# Patient Record
Sex: Female | Born: 1975 | Race: White | Hispanic: No | Smoking: Current every day smoker
Health system: Southern US, Community
[De-identification: ages and names within clinical notes are randomized; demographics above are authoritative.]

## PROBLEM LIST (undated history)

## (undated) DIAGNOSIS — M549 Dorsalgia, unspecified: Secondary | ICD-10-CM

## (undated) HISTORY — PX: ABDOMINAL HYSTERECTOMY: SHX81

---

## 2013-05-16 ENCOUNTER — Encounter (HOSPITAL_COMMUNITY): Payer: Self-pay | Admitting: Emergency Medicine

## 2013-05-16 ENCOUNTER — Emergency Department (HOSPITAL_COMMUNITY)
Admission: EM | Admit: 2013-05-16 | Discharge: 2013-05-16 | Disposition: A | Discharge status: Home / Self Care | Payer: Federal, State, Local not specified - PPO | Attending: Emergency Medicine | Admitting: Emergency Medicine

## 2013-05-16 DIAGNOSIS — F17200 Nicotine dependence, unspecified, uncomplicated: Secondary | ICD-10-CM | POA: Insufficient documentation

## 2013-05-16 DIAGNOSIS — Z79899 Other long term (current) drug therapy: Secondary | ICD-10-CM | POA: Insufficient documentation

## 2013-05-16 DIAGNOSIS — M545 Low back pain, unspecified: Secondary | ICD-10-CM | POA: Insufficient documentation

## 2013-05-16 DIAGNOSIS — Z791 Long term (current) use of non-steroidal anti-inflammatories (NSAID): Secondary | ICD-10-CM | POA: Insufficient documentation

## 2013-05-16 DIAGNOSIS — M542 Cervicalgia: Secondary | ICD-10-CM | POA: Insufficient documentation

## 2013-05-16 DIAGNOSIS — G8929 Other chronic pain: Secondary | ICD-10-CM | POA: Insufficient documentation

## 2013-05-16 HISTORY — DX: Dorsalgia, unspecified: M54.9

## 2013-05-16 MED ORDER — OXYCODONE-ACETAMINOPHEN 10-325 MG PO TABS: 1.0000 | ORAL_TABLET | Freq: Four times a day (QID) | ORAL | Status: DC | PRN
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
## 2013-05-16 NOTE — ED Notes (Signed)
Pt reports chronic neck and back pain, just moved here from [place] and does not have any more pain meds. Also has a recent cough/cold and congestion. No fevers. Breathing easily

## 2013-05-16 NOTE — ED Provider Notes (Signed)
Medical screening examination/treatment/procedure(s) were performed by non-physician practitioner and as supervising physician I was immediately available for consultation/collaboration.   Carolie Mcilrath T Chayce Robbins, MD 05/16/13 1555 

## 2013-05-26 ENCOUNTER — Encounter (HOSPITAL_COMMUNITY): Payer: Self-pay | Admitting: Emergency Medicine

## 2013-05-26 ENCOUNTER — Emergency Department (HOSPITAL_COMMUNITY)
Admission: EM | Admit: 2013-05-26 | Discharge: 2013-05-26 | Disposition: A | Discharge status: Home / Self Care | Payer: Federal, State, Local not specified - PPO | Attending: Emergency Medicine | Admitting: Emergency Medicine

## 2013-05-26 DIAGNOSIS — Z791 Long term (current) use of non-steroidal anti-inflammatories (NSAID): Secondary | ICD-10-CM | POA: Insufficient documentation

## 2013-05-26 DIAGNOSIS — G8921 Chronic pain due to trauma: Secondary | ICD-10-CM | POA: Insufficient documentation

## 2013-05-26 DIAGNOSIS — F17200 Nicotine dependence, unspecified, uncomplicated: Secondary | ICD-10-CM | POA: Insufficient documentation

## 2013-05-26 DIAGNOSIS — M545 Low back pain, unspecified: Secondary | ICD-10-CM | POA: Insufficient documentation

## 2013-05-26 DIAGNOSIS — G8929 Other chronic pain: Secondary | ICD-10-CM

## 2013-05-26 DIAGNOSIS — Z79899 Other long term (current) drug therapy: Secondary | ICD-10-CM | POA: Insufficient documentation

## 2013-05-26 NOTE — ED Notes (Signed)
Pt here c/o chronic lower back pain; pt sts just moved from [place] and has been unable to get set up with PCP or pain clinic; pt will need assistance with referral; pt denies new pain just needs rx

## 2013-05-26 NOTE — ED Provider Notes (Signed)
Medical screening examination/treatment/procedure(s) were performed by non-physician practitioner and as supervising physician I was immediately available for consultation/collaboration.  Flint Melter, MD 05/26/13 [phone]
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
## 2013-07-01 ENCOUNTER — Ambulatory Visit: Payer: Self-pay | Admitting: Pain Medicine

## 2013-10-29 DIAGNOSIS — G8929 Other chronic pain: Secondary | ICD-10-CM | POA: Insufficient documentation

## 2015-01-07 ENCOUNTER — Other Ambulatory Visit: Payer: Self-pay

## 2015-01-07 DIAGNOSIS — M2550 Pain in unspecified joint: Principal | ICD-10-CM

## 2015-01-07 DIAGNOSIS — G8929 Other chronic pain: Secondary | ICD-10-CM

## 2015-01-07 MED ORDER — MELOXICAM 7.5 MG PO TABS: 7.5000 mg | ORAL_TABLET | Freq: Two times a day (BID) | ORAL | Status: DC

## 2015-01-07 NOTE — Telephone Encounter (Signed)
Received a fax from [facility] on [address] [name]), requesting a refill of her, meloxicam 7.5mg 

## 2015-02-09 ENCOUNTER — Other Ambulatory Visit: Payer: Self-pay | Admitting: Family Medicine

## 2015-02-09 DIAGNOSIS — M4727 Other spondylosis with radiculopathy, lumbosacral region: Secondary | ICD-10-CM

## 2015-02-09 MED ORDER — OXYCODONE-ACETAMINOPHEN 10-325 MG PO TABS: 1.0000 | ORAL_TABLET | Freq: Two times a day (BID) | ORAL | Status: DC | PRN

## 2015-02-09 NOTE — Telephone Encounter (Signed)
Refill request was sent to Dr. Bobetta Lime for approval and submission.,

## 2015-02-09 NOTE — Telephone Encounter (Signed)
Patient scheduled appointment for August. She is requesting a refill on Oxycodone.

## 2015-02-23 ENCOUNTER — Other Ambulatory Visit: Payer: Self-pay | Admitting: Family Medicine

## 2015-02-23 DIAGNOSIS — F418 Other specified anxiety disorders: Secondary | ICD-10-CM

## 2015-03-01 ENCOUNTER — Ambulatory Visit: Payer: Self-pay | Admitting: Family Medicine

## 2015-05-19 ENCOUNTER — Other Ambulatory Visit: Payer: Self-pay | Admitting: Family Medicine

## 2015-05-19 ENCOUNTER — Ambulatory Visit (INDEPENDENT_AMBULATORY_CARE_PROVIDER_SITE_OTHER): Payer: Federal, State, Local not specified - PPO | Admitting: Family Medicine

## 2015-05-19 ENCOUNTER — Encounter: Payer: Self-pay | Admitting: Family Medicine

## 2015-05-19 VITALS — BP 122/80 | HR 90 | Temp 98.1°F | Resp 16 | Ht 67.0 in | Wt 174.1 lb

## 2015-05-19 DIAGNOSIS — B850 Pediculosis due to Pediculus humanus capitis: Secondary | ICD-10-CM | POA: Insufficient documentation

## 2015-05-19 DIAGNOSIS — Z131 Encounter for screening for diabetes mellitus: Secondary | ICD-10-CM | POA: Insufficient documentation

## 2015-05-19 DIAGNOSIS — B369 Superficial mycosis, unspecified: Secondary | ICD-10-CM | POA: Insufficient documentation

## 2015-05-19 DIAGNOSIS — M47817 Spondylosis without myelopathy or radiculopathy, lumbosacral region: Secondary | ICD-10-CM | POA: Insufficient documentation

## 2015-05-19 DIAGNOSIS — G5600 Carpal tunnel syndrome, unspecified upper limb: Secondary | ICD-10-CM | POA: Insufficient documentation

## 2015-05-19 DIAGNOSIS — Z789 Other specified health status: Secondary | ICD-10-CM | POA: Insufficient documentation

## 2015-05-19 DIAGNOSIS — H60339 Swimmer's ear, unspecified ear: Secondary | ICD-10-CM | POA: Insufficient documentation

## 2015-05-19 DIAGNOSIS — M47812 Spondylosis without myelopathy or radiculopathy, cervical region: Secondary | ICD-10-CM | POA: Insufficient documentation

## 2015-05-19 DIAGNOSIS — M549 Dorsalgia, unspecified: Secondary | ICD-10-CM

## 2015-05-19 DIAGNOSIS — J320 Chronic maxillary sinusitis: Secondary | ICD-10-CM | POA: Insufficient documentation

## 2015-05-19 DIAGNOSIS — M542 Cervicalgia: Secondary | ICD-10-CM | POA: Diagnosis not present

## 2015-05-19 DIAGNOSIS — H6690 Otitis media, unspecified, unspecified ear: Secondary | ICD-10-CM | POA: Insufficient documentation

## 2015-05-19 DIAGNOSIS — M1990 Unspecified osteoarthritis, unspecified site: Secondary | ICD-10-CM | POA: Insufficient documentation

## 2015-05-19 DIAGNOSIS — Z0000 Encounter for general adult medical examination without abnormal findings: Secondary | ICD-10-CM | POA: Insufficient documentation

## 2015-05-19 DIAGNOSIS — D1800 Hemangioma unspecified site: Secondary | ICD-10-CM | POA: Insufficient documentation

## 2015-05-19 DIAGNOSIS — F3341 Major depressive disorder, recurrent, in partial remission: Secondary | ICD-10-CM

## 2015-05-19 DIAGNOSIS — M5412 Radiculopathy, cervical region: Secondary | ICD-10-CM | POA: Insufficient documentation

## 2015-05-19 DIAGNOSIS — G8929 Other chronic pain: Secondary | ICD-10-CM | POA: Insufficient documentation

## 2015-05-19 DIAGNOSIS — Z124 Encounter for screening for malignant neoplasm of cervix: Secondary | ICD-10-CM | POA: Insufficient documentation

## 2015-05-19 DIAGNOSIS — Z23 Encounter for immunization: Secondary | ICD-10-CM | POA: Insufficient documentation

## 2015-05-19 DIAGNOSIS — M2550 Pain in unspecified joint: Secondary | ICD-10-CM

## 2015-05-19 DIAGNOSIS — Z13220 Encounter for screening for lipoid disorders: Secondary | ICD-10-CM | POA: Insufficient documentation

## 2015-05-19 DIAGNOSIS — F418 Other specified anxiety disorders: Secondary | ICD-10-CM

## 2015-05-19 DIAGNOSIS — E041 Nontoxic single thyroid nodule: Secondary | ICD-10-CM | POA: Insufficient documentation

## 2015-05-19 MED ORDER — BUPROPION HCL ER (XL) 150 MG PO TB24: 150.0000 mg | ORAL_TABLET | Freq: Every day | ORAL | Status: DC

## 2015-05-19 MED ORDER — DULOXETINE HCL 60 MG PO CPEP: 60.0000 mg | ORAL_CAPSULE | Freq: Two times a day (BID) | ORAL | Status: DC

## 2015-05-19 MED ORDER — PREGABALIN 50 MG PO CAPS: ORAL_CAPSULE | ORAL | Status: DC

## 2015-05-19 MED ORDER — MELOXICAM 15 MG PO TABS: 15.0000 mg | ORAL_TABLET | Freq: Every day | ORAL | Status: DC

## 2015-05-19 MED ORDER — OXYCODONE-ACETAMINOPHEN 10-325 MG PO TABS: 1.0000 | ORAL_TABLET | Freq: Two times a day (BID) | ORAL | Status: DC | PRN

## 2015-05-19 MED ORDER — OXYCODONE-ACETAMINOPHEN 10-325 MG PO TABS: 1.0000 | ORAL_TABLET | Freq: Three times a day (TID) | ORAL | Status: DC | PRN

## 2015-05-19 MED ORDER — TIZANIDINE HCL 4 MG PO TABS: 4.0000 mg | ORAL_TABLET | Freq: Three times a day (TID) | ORAL | Status: DC | PRN

## 2015-05-19 MED ORDER — DULOXETINE HCL 60 MG PO CPEP: 60.0000 mg | ORAL_CAPSULE | Freq: Every day | ORAL | Status: DC

## 2015-05-19 NOTE — Progress Notes (Signed)
Name: Nichole Beltran   MRN: [id number]    DOB: 06-18-1976   Date:05/19/2015       Progress Note  Subjective  Chief Complaint  Chief Complaint  Patient presents with  . Medication Refill  . Follow-up    From last visit 10/27/2014    HPI   Nichole Beltran is a 39 year old female with chronic neck and back pain here for refills.   Joint/Muscle Pain: Patient complains of arthralgias for which has been present for several years. Pain is located in multiple joints including posterior neck, lower back, lower extremities. Pain is described as aching, stabbing and throbbing, and is moderate but has been known to be severe at times. Associated symptoms include: depression, decreased range of motion and muscle tension. The patient is currently using Meloxicam 15 mg a day, Tizanadine 4 mg as needed, Percocet 10-315 mg twice a day as needed and Lyrica 50 mg one in the morning one at lunch and 2 at night. In the past I had tried referring her to pain management specialist but her case is related to workman's comp and due to records and paperwork she never quite established with specialists.   Depression: Patient complains of depression which is stable. She previously complained of depressed mood, difficulty concentrating, fatigue, feelings of worthlessness/guilt, impaired memory and insomnia. Onset was approximately several years ago, stable since that time.  She denies current suicidal and homicidal plan or intent.   Family history significant for anxiety and depression.Possible organic causes contributing are: neuro.  Risk factors: previous episode of depression Previous treatment includes Celexa and Wellbutrin and individual therapy. She complains of the following side effects from the treatment: none.     Past Medical History  Diagnosis Date  . Back pain     Patient Active Problem List   Diagnosis Date Noted  . Hemangioma 05/19/2015  . Chronic neck and back pain 05/19/2015  . Thyroid nodule  05/19/2015  . Chronic pain of multiple joints 05/19/2015  . Encounter for general adult medical examination without abnormal findings 05/19/2015  . Back pain, chronic 05/19/2015  . Carpal tunnel syndrome 05/19/2015  . Cervical nerve root disorder 05/19/2015  . Cervical spondylosis without myelopathy 05/19/2015  . Arthritis, degenerative 05/19/2015  . Clinical depression 05/19/2015  . Encounter for screening for diabetes mellitus 05/19/2015  . Cutaneous fungal infection 05/19/2015  . Gravida 2 para 2 05/19/2015  . Head lice 28/36/6294  . Flu vaccine need 05/19/2015  . Maxillary antritis 05/19/2015  . Lumbar and sacral osteoarthritis 05/19/2015  . Chronic cervical pain 05/19/2015  . Beach ear 05/19/2015  . Middle ear infection 05/19/2015  . Encounter for screening for lipoid disorders 05/19/2015  . Pap smear for cervical cancer screening 05/19/2015  . Chronic pain 10/29/2013    Social History  Substance Use Topics  . Smoking status: Current Every Day Smoker    Types: Cigarettes  . Smokeless tobacco: Not on file  . Alcohol Use: No     Current outpatient prescriptions:  .  buPROPion (WELLBUTRIN XL) 150 MG 24 hr tablet, Take 150 mg by mouth at bedtime., Disp: , Rfl:  .  DULoxetine (CYMBALTA) 60 MG capsule, TAKE ONE CAPSULE BY MOUTH TWICE DAILY, Disp: 180 capsule, Rfl: 1 .  meloxicam (MOBIC) 15 MG tablet, Take 15 mg by mouth daily., Disp: , Rfl:  .  oxyCODONE-acetaminophen (PERCOCET) 10-325 MG per tablet, Take 1 tablet by mouth 2 (two) times daily as needed for pain., Disp: 60 tablet, Rfl:  0 .  pregabalin (LYRICA) 50 MG capsule, Take 50-100 mg by mouth 3 (three) times daily. Take 50mg  in the morning, 50 mg at lunch, and 100mg  at bedtime., Disp: , Rfl:  .  tiZANidine (ZANAFLEX) 4 MG tablet, Take 4 mg by mouth every 6 (six) hours as needed (muscle spasms)., Disp: , Rfl:   Past Surgical History  Procedure Laterality Date  . Abdominal hysterectomy      No family history on  file.  No Known Allergies   Review of Systems  CONSTITUTIONAL: No significant weight changes, fever, chills, weakness or fatigue.  HEENT:  - Eyes: No visual changes.  - Ears: No auditory changes. No pain.  - Nose: No sneezing, congestion, runny nose. - Throat: No sore throat. No changes in swallowing. SKIN: No rash or itching.  CARDIOVASCULAR: No chest pain, chest pressure or chest discomfort. No palpitations or edema.  RESPIRATORY: No shortness of breath, cough or sputum.  NEUROLOGICAL: No headache, dizziness, syncope, paralysis, ataxia, numbness or tingling in the extremities. No memory changes. No change in bowel or bladder control.  MUSCULOSKELETAL: Chronic joint pain. No muscle pain. HEMATOLOGIC: No anemia, bleeding or bruising.  LYMPHATICS: No enlarged lymph nodes.  PSYCHIATRIC: No change in mood. No change in sleep pattern.  ENDOCRINOLOGIC: No reports of sweating, cold or heat intolerance. No polyuria or polydipsia.     Objective  BP 122/80 mmHg  Pulse 90  Temp(Src) 98.1 F (36.7 C) (Oral)  Resp 16  Ht 5\' 7"  (1.702 m)  Wt 174 lb 1.6 oz (78.971 kg)  BMI 27.26 kg/m2  SpO2 98% Body mass index is 27.26 kg/(m^2).  Physical Exam  Constitutional: Patient appears well-developed and well-nourished. In no distress.  Neck: Normal range of motion. Neck supple. No JVD present. No thyromegaly present.  Cardiovascular: Normal rate, regular rhythm and normal heart sounds.  No murmur heard.  Pulmonary/Chest: Effort normal and breath sounds normal. No respiratory distress. Musculoskeletal: Normal range of motion bilateral UE and LE, no joint effusions. Peripheral vascular: Bilateral LE no edema. Neurological: CN II-XII grossly intact with no focal deficits. Alert and oriented to person, place, and time. Coordination, balance, strength, speech and gait are normal.  Skin: Skin is warm and dry. No rash noted. No erythema.  Psychiatric: Patient has a normal mood and affect. Behavior  is normal in office today. Judgment and thought content normal in office today.  Assessment & Plan   1. Chronic neck and back pain Stable pain control. Three month supply provided. May call for 3 month refill and follow up in 6 months if remains stable.   The patient has been prescribed a controled substance today under the agreement of a filed pain treatment regimen contract.  With use of this medication they verbalize understanding the potential risk of addiction, abuse, and misuse, which can lead to overdose and death. The patient may not obtain and use other illicit or controled substances from any other sources while under the aformentioned contract. A urine drug screen will be performed periodically and the patient's name will be reviewed on the [place] Controlled Substance Reporting System regularly.  The patient expresses understanding the above statement and agreement to comply.  The patient has been counseled on the proper use, side effects and potential interactions of the new medication with other prescribed and OTC medications. Under no circumstances is this (and any other) medication to be use with alcohol or other illicit drugs. This medication is not to be crushed, chewed, sniffed, injected or  used in any other way other than what is stated in the directions. This medication is to be used at the frequency and quantity that is stated in the directions. This medication is to be used only by the individual who's name is on the prescription bottle. Drug sharing and selling is unacceptable. Patient voices understanding what has been said today.  - pregabalin (LYRICA) 50 MG capsule; Take 50mg  PO in the morning, 50 mg at lunch, and 100mg  at bedtime.  Dispense: 120 capsule; Refill: 5 - oxyCODONE-acetaminophen (PERCOCET) 10-325 MG tablet; Take 1 tablet by mouth 2 (two) times daily as needed for pain.  Dispense: 60 tablet; Refill: 0 - oxyCODONE-acetaminophen (PERCOCET) 10-325 MG tablet; Take 1 tablet  by mouth 2 (two) times daily as needed for pain.  Dispense: 60 tablet; Refill: 0 - oxyCODONE-acetaminophen (PERCOCET) 10-325 MG tablet; Take 1 tablet by mouth every 8 (eight) hours as needed for pain.  Dispense: 60 tablet; Refill: 0 - meloxicam (MOBIC) 15 MG tablet; Take 1 tablet (15 mg total) by mouth daily.  Dispense: 90 tablet; Refill: 2 - tiZANidine (ZANAFLEX) 4 MG tablet; Take 1 tablet (4 mg total) by mouth every 8 (eight) hours as needed (muscle spasms).  Dispense: 100 tablet; Refill: 3  2. Major depressive disorder, recurrent episode, in partial remission with anxious distress (HCC) Stable.  - buPROPion (WELLBUTRIN XL) 150 MG 24 hr tablet; Take 1 tablet (150 mg total) by mouth at bedtime.  Dispense: 90 tablet; Refill: 2 - DULoxetine (CYMBALTA) 60 MG capsule; Take 1 capsule (60 mg total) by mouth daily.  Dispense: 90 capsule; Refill: 2

## 2015-08-19 ENCOUNTER — Telehealth: Payer: Self-pay | Admitting: Family Medicine

## 2015-08-19 NOTE — Telephone Encounter (Signed)
ERRENOUS °

## 2015-08-27 ENCOUNTER — Ambulatory Visit (INDEPENDENT_AMBULATORY_CARE_PROVIDER_SITE_OTHER): Payer: Federal, State, Local not specified - PPO | Admitting: Family Medicine

## 2015-08-27 ENCOUNTER — Encounter: Payer: Self-pay | Admitting: Family Medicine

## 2015-08-27 VITALS — BP 118/86 | HR 89 | Temp 97.7°F | Resp 14 | Ht 67.0 in | Wt 184.0 lb

## 2015-08-27 DIAGNOSIS — F418 Other specified anxiety disorders: Principal | ICD-10-CM

## 2015-08-27 DIAGNOSIS — E663 Overweight: Secondary | ICD-10-CM | POA: Insufficient documentation

## 2015-08-27 DIAGNOSIS — M542 Cervicalgia: Secondary | ICD-10-CM

## 2015-08-27 DIAGNOSIS — F3341 Major depressive disorder, recurrent, in partial remission: Secondary | ICD-10-CM

## 2015-08-27 DIAGNOSIS — M47812 Spondylosis without myelopathy or radiculopathy, cervical region: Secondary | ICD-10-CM

## 2015-08-27 DIAGNOSIS — M47817 Spondylosis without myelopathy or radiculopathy, lumbosacral region: Secondary | ICD-10-CM | POA: Diagnosis not present

## 2015-08-27 DIAGNOSIS — M549 Dorsalgia, unspecified: Secondary | ICD-10-CM

## 2015-08-27 DIAGNOSIS — G8929 Other chronic pain: Secondary | ICD-10-CM

## 2015-08-27 MED ORDER — PREGABALIN 50 MG PO CAPS: ORAL_CAPSULE | ORAL | Status: DC

## 2015-08-27 MED ORDER — OXYCODONE-ACETAMINOPHEN 10-325 MG PO TABS: 1.0000 | ORAL_TABLET | Freq: Three times a day (TID) | ORAL | Status: DC | PRN

## 2015-08-27 MED ORDER — OXYCODONE-ACETAMINOPHEN 10-325 MG PO TABS: 1.0000 | ORAL_TABLET | Freq: Two times a day (BID) | ORAL | Status: DC | PRN

## 2015-08-27 MED ORDER — TIZANIDINE HCL 4 MG PO TABS: 4.0000 mg | ORAL_TABLET | Freq: Three times a day (TID) | ORAL | Status: DC | PRN

## 2015-08-27 NOTE — Progress Notes (Signed)
Name: Nichole Beltran   MRN: [id number]    DOB: [birth year]   Date:08/27/2015       Progress Note  Subjective  Chief Complaint  Chief Complaint  Patient presents with  . Medication Refill  . Depression  . Pain    lower back    HPI  Nichole Beltran is a 40 year old female with chronic neck and back pain here for refills. Has gained weight.  Joint/Muscle Pain: Patient complains of arthralgias for which has been present for several years. Pain is located in multiple joints including posterior neck, lower back, lower extremities. Pain is described as aching, stabbing and throbbing, and is moderate but has been known to be severe at times. Associated symptoms include: depression, decreased range of motion and muscle tension. The patient is currently using Meloxicam 15 mg a day, Tizanadine 4 mg as needed, Percocet 10-315 mg twice a day as needed and Lyrica 50 mg one in the morning one at lunch and 2 at night. In the past I had tried referring her to pain management specialist but her case is related to workman's comp and due to records and paperwork she never quite established with specialists.   Depression: Patient complains of depression which is stable. She previously complained of depressed mood, difficulty concentrating, fatigue, feelings of worthlessness/guilt, impaired memory and insomnia. Onset was approximately several years ago, stable since that time. She denies current suicidal and homicidal plan or intent. Family history significant for anxiety and depression.Possible organic causes contributing are: neuro. Risk factors: previous episode of depression Previous treatment includes Celexa and Wellbutrin and individual therapy. She complains of the following side effects from the treatment: none.  Past Medical History  Diagnosis Date  . Back pain     Patient Active Problem List   Diagnosis Date Noted  . Hemangioma 05/19/2015  . Chronic neck and back pain 05/19/2015  . Thyroid  nodule 05/19/2015  . Chronic pain of multiple joints 05/19/2015  . Back pain, chronic 05/19/2015  . Carpal tunnel syndrome 05/19/2015  . Cervical nerve root disorder 05/19/2015  . Cervical spondylosis without myelopathy 05/19/2015  . Arthritis, degenerative 05/19/2015  . Major depressive disorder, recurrent episode, in partial remission with anxious distress ([place]) 05/19/2015  . Gravida 2 para 2 05/19/2015  . Flu vaccine need 05/19/2015  . Lumbar and sacral osteoarthritis 05/19/2015  . Chronic cervical pain 05/19/2015  . Pap smear for cervical cancer screening 05/19/2015  . Chronic pain 10/29/2013    Social History  Substance Use Topics  . Smoking status: Current Every Day Smoker    Types: Cigarettes  . Smokeless tobacco: Not on file  . Alcohol Use: No     Current outpatient prescriptions:  .  buPROPion (WELLBUTRIN XL) 150 MG 24 hr tablet, Take 1 tablet (150 mg total) by mouth at bedtime., Disp: 90 tablet, Rfl: 2 .  DULoxetine (CYMBALTA) 60 MG capsule, Take 1 capsule (60 mg total) by mouth daily., Disp: 90 capsule, Rfl: 2 .  meloxicam (MOBIC) 15 MG tablet, Take 1 tablet (15 mg total) by mouth daily., Disp: 90 tablet, Rfl: 2 .  oxyCODONE-acetaminophen (PERCOCET) 10-325 MG tablet, Take 1 tablet by mouth 2 (two) times daily as needed for pain., Disp: 60 tablet, Rfl: 0 .  oxyCODONE-acetaminophen (PERCOCET) 10-325 MG tablet, Take 1 tablet by mouth 2 (two) times daily as needed for pain., Disp: 60 tablet, Rfl: 0 .  oxyCODONE-acetaminophen (PERCOCET) 10-325 MG tablet, Take 1 tablet by mouth every 8 (eight) hours as  needed for pain., Disp: 60 tablet, Rfl: 0 .  pregabalin (LYRICA) 50 MG capsule, Take 50mg  PO in the morning, 50 mg at lunch, and 100mg  at bedtime., Disp: 120 capsule, Rfl: 5 .  tiZANidine (ZANAFLEX) 4 MG tablet, Take 1 tablet (4 mg total) by mouth every 8 (eight) hours as needed (muscle spasms)., Disp: 100 tablet, Rfl: 3  Past Surgical History  Procedure Laterality Date  .  Abdominal hysterectomy      No family history on file.  No Known Allergies   Review of Systems  CONSTITUTIONAL: No significant weight changes, fever, chills, weakness or fatigue.  HEENT:  - Eyes: No visual changes.  - Ears: No auditory changes. No pain.  - Nose: No sneezing, congestion, runny nose. - Throat: No sore throat. No changes in swallowing. SKIN: No rash or itching.  CARDIOVASCULAR: No chest pain, chest pressure or chest discomfort. No palpitations or edema.  RESPIRATORY: No shortness of breath, cough or sputum.  GASTROINTESTINAL: No anorexia, nausea, vomiting. No changes in bowel habits. No abdominal pain or blood.  NEUROLOGICAL: No headache, dizziness, syncope, paralysis, ataxia, numbness or tingling in the extremities. No memory changes. No change in bowel or bladder control.  MUSCULOSKELETAL: No joint pain. No muscle pain. HEMATOLOGIC: No anemia, bleeding or bruising.  LYMPHATICS: No enlarged lymph nodes.  PSYCHIATRIC: No change in mood. No change in sleep pattern.  ENDOCRINOLOGIC: No reports of sweating, cold or heat intolerance. No polyuria or polydipsia.     Objective  BP 118/86 mmHg  Pulse 89  Temp(Src) 97.7 F (36.5 C) (Oral)  Resp 14  Ht 5\' 7"  (1.702 m)  Wt 184 lb (83.462 kg)  BMI 28.81 kg/m2  SpO2 98% Body mass index is 28.81 kg/(m^2).  Physical Exam  Constitutional: Patient appears well-developed and well-nourished. In no distress.  Neck: Normal range of motion. Neck supple. No JVD present. No thyromegaly present.  Cardiovascular: Normal rate, regular rhythm and normal heart sounds. No murmur heard.  Pulmonary/Chest: Effort normal and breath sounds normal. No respiratory distress. Musculoskeletal: Normal range of motion bilateral UE and LE, no joint effusions. Peripheral vascular: Bilateral LE no edema. Neurological: CN II-XII grossly intact with no focal deficits. Alert and oriented to person, place, and time. Coordination, balance, strength,  speech and gait are normal.  Skin: Skin is warm and dry. No rash noted. No erythema.  Psychiatric: Patient has a normal mood and affect. Behavior is normal in office today. Judgment and thought content normal in office today.  Assessment & Plan  1. Major depressive disorder, recurrent episode, in partial remission with anxious distress (HCC) Stable. Refills sent to pharmacy 05/2015.   2. Chronic neck and back pain Stable. Doing well on current regimen.   The patient has been prescribed a controled substance today under the agreement of a filed pain treatment regimen contract.  With use of this medication they verbalize understanding the potential risk of addiction, abuse, and misuse, which can lead to overdose and death. The patient may not obtain and use other illicit or controled substances from any other sources while under the aformentioned contract. A urine drug screen will be performed periodically and the patient's name will be reviewed on the [place] Controlled Substance Reporting System regularly.  The patient expresses understanding the above statement and agreement to comply.  The patient has been counseled on the proper use, side effects and potential interactions of the new medication with other prescribed and OTC medications. Under no circumstances is this (and any other)  medication to be use with alcohol or other illicit drugs. This medication is not to be crushed, chewed, sniffed, injected or used in any other way other than what is stated in the directions. This medication is to be used at the frequency and quantity that is stated in the directions. This medication is to be used only by the individual who's name is on the prescription bottle. Drug sharing and selling is unacceptable. Patient voices understanding what has been said today.   - tiZANidine (ZANAFLEX) 4 MG tablet; Take 1 tablet (4 mg total) by mouth every 8 (eight) hours as needed (muscle spasms).  Dispense: 100 tablet;  Refill: 3 - pregabalin (LYRICA) 50 MG capsule; Take 50mg  PO in the morning, 50 mg at lunch, and 100mg  at bedtime.  Dispense: 120 capsule; Refill: 5 - oxyCODONE-acetaminophen (PERCOCET) 10-325 MG tablet; Take 1 tablet by mouth every 8 (eight) hours as needed for pain.  Dispense: 60 tablet; Refill: 0 - oxyCODONE-acetaminophen (PERCOCET) 10-325 MG tablet; Take 1 tablet by mouth 2 (two) times daily as needed for pain.  Dispense: 60 tablet; Refill: 0 - oxyCODONE-acetaminophen (PERCOCET) 10-325 MG tablet; Take 1 tablet by mouth 2 (two) times daily as needed for pain.  Dispense: 60 tablet; Refill: 0  3. Cervical spondylosis without myelopathy Stable.  4. Lumbar and sacral osteoarthritis Stable.  5. Overweight (BMI 25.0-29.9) Has gained about 10 lbs since 05/2015. Needs to monitor weight and adjust lifestyle to regain ideal BMI.

## 2015-10-21 ENCOUNTER — Other Ambulatory Visit: Payer: Self-pay

## 2015-10-21 DIAGNOSIS — G8929 Other chronic pain: Principal | ICD-10-CM

## 2015-10-21 DIAGNOSIS — M542 Cervicalgia: Secondary | ICD-10-CM

## 2015-10-21 DIAGNOSIS — M549 Dorsalgia, unspecified: Principal | ICD-10-CM

## 2015-10-21 NOTE — Telephone Encounter (Signed)
It's much too early for [place] to refill the zanaflex I called pharmacy to get fill hx She has filled 10/20/15 and the bottle is there waiting for pick-up Just got a refill on 10/14/15 for 28 pills from an old Rx dated May 19, 2015 She received #272 pills on Dec 30th (maybe that was a 3 month supply and the 28 pills completed the 300 amount?); we guessed that was why she got 272 pills at a time I told pharmacy tech I am not approving any new refills; she says that's okay because there are still refills remaining from Feb prescription ----------------------------- Please call patient We want to give her a heads up that I do not plan to do chronic pain management here as Dr. Nadine Counts has been doing We'll be glad to refer her to a pain clinic, but the Lyrica and oxycodone and tizanidine prescriptions will need to come from a pain clinic or spine specialist from now on Let us know where she'd like to go and we'll put in that referral

## 2015-10-25 NOTE — Telephone Encounter (Signed)
Tried to call patient several times.  Left voicemail

## 2015-11-08 ENCOUNTER — Telehealth: Payer: Self-pay | Admitting: Family Medicine

## 2015-11-08 ENCOUNTER — Other Ambulatory Visit: Payer: Self-pay

## 2015-11-08 DIAGNOSIS — M1990 Unspecified osteoarthritis, unspecified site: Secondary | ICD-10-CM

## 2015-11-08 DIAGNOSIS — F3341 Major depressive disorder, recurrent, in partial remission: Secondary | ICD-10-CM

## 2015-11-08 DIAGNOSIS — M2550 Pain in unspecified joint: Secondary | ICD-10-CM

## 2015-11-08 DIAGNOSIS — F418 Other specified anxiety disorders: Principal | ICD-10-CM

## 2015-11-08 DIAGNOSIS — M47817 Spondylosis without myelopathy or radiculopathy, lumbosacral region: Secondary | ICD-10-CM

## 2015-11-08 DIAGNOSIS — G8929 Other chronic pain: Secondary | ICD-10-CM

## 2015-11-08 NOTE — Telephone Encounter (Signed)
Patient requesting refill. 

## 2015-11-08 NOTE — Assessment & Plan Note (Signed)
Refer to pain clinic? 

## 2015-11-08 NOTE — Telephone Encounter (Signed)
Nichole Beltran called stating she received a call that Dr Sanda Klein would not refill her pain medication, but that she would do a referral to pain management for her. Nichole Beltran is requesting for that to be done. Please advise Nichole Beltran once this is completed.

## 2015-11-08 NOTE — Telephone Encounter (Signed)
Dr. Nadine Counts prescribed a 9 month supply of this medicine in November; she should not be out yet; she should have enough until August

## 2015-11-08 NOTE — Telephone Encounter (Signed)
Referral generated; thank you

## 2015-11-08 NOTE — Telephone Encounter (Signed)
Please place referral to pain clinic

## 2015-11-11 ENCOUNTER — Ambulatory Visit: Payer: Federal, State, Local not specified - PPO | Admitting: Family Medicine

## 2015-11-16 ENCOUNTER — Ambulatory Visit: Payer: Federal, State, Local not specified - PPO | Admitting: Family Medicine

## 2015-12-10 ENCOUNTER — Other Ambulatory Visit: Payer: Self-pay

## 2015-12-10 DIAGNOSIS — G8929 Other chronic pain: Principal | ICD-10-CM

## 2015-12-10 DIAGNOSIS — M549 Dorsalgia, unspecified: Principal | ICD-10-CM

## 2015-12-10 DIAGNOSIS — M542 Cervicalgia: Secondary | ICD-10-CM

## 2015-12-10 MED ORDER — GABAPENTIN 300 MG PO CAPS: ORAL_CAPSULE | ORAL | Status: DC

## 2015-12-10 NOTE — Telephone Encounter (Signed)
Patient no showed for appointment on 11/11/15, then cancelled the appointment on 11/16/15 She was referred to a pain clinic on 11/08/15 [facility] site reviewed Lyrica 50 mg #120 filled on 11/11/15 I'm not going to prescribe her any controlled substances, but will give her a taper I called pharmacy to prescribe a taper of gabapentin; he agreed that 300 mg TID x 3 days, then BID x 3 days, then daily x 3 days then off sounded appropriate

## 2015-12-15 ENCOUNTER — Telehealth: Payer: Self-pay | Admitting: Family Medicine

## 2015-12-15 NOTE — Telephone Encounter (Signed)
Pt is needing refill on her Lyrica. Pharm is Electrical [facility] dr

## 2015-12-15 NOTE — Telephone Encounter (Signed)
Lyrica denied She no showed and cancelled last two appointments, respectively Please let patient know that I worked with her pharmacist, and we are switching her from Lyrica to gabapentin and tapering her down and then off Lyrica is a controlled substance, and I'm not going to prescribe that We don't want her to stop that chemical abruptly, so I prescribed gabapentin and she'll taper down on that as directed Prescription was sent 12/10/15 Thank you

## 2015-12-16 NOTE — Telephone Encounter (Signed)
Pt.notified

## 2015-12-30 ENCOUNTER — Ambulatory Visit: Payer: Federal, State, Local not specified - PPO | Admitting: Family Medicine

## 2016-02-28 ENCOUNTER — Other Ambulatory Visit: Payer: Self-pay

## 2016-02-28 DIAGNOSIS — F418 Other specified anxiety disorders: Principal | ICD-10-CM

## 2016-02-28 DIAGNOSIS — F3341 Major depressive disorder, recurrent, in partial remission: Secondary | ICD-10-CM

## 2016-02-29 MED ORDER — BUPROPION HCL ER (XL) 150 MG PO TB24: 150.0000 mg | ORAL_TABLET | Freq: Every day | ORAL | 0 refills | Status: DC

## 2016-02-29 NOTE — Telephone Encounter (Signed)
LMOM for pt to call the office back. °

## 2016-02-29 NOTE — Telephone Encounter (Signed)
Patient has cancelled / no showed the last 3 appointments in a row I am sending limited Rx with note asking her to schedule an appt Once she schedules, I'll approve enough to last until she is seen, but she'll need to be seen an evaluated to continue medicine

## 2017-04-18 ENCOUNTER — Ambulatory Visit (INDEPENDENT_AMBULATORY_CARE_PROVIDER_SITE_OTHER): Payer: Federal, State, Local not specified - PPO | Admitting: Family Medicine

## 2017-04-18 ENCOUNTER — Ambulatory Visit (INDEPENDENT_AMBULATORY_CARE_PROVIDER_SITE_OTHER): Payer: Federal, State, Local not specified - PPO

## 2017-04-18 VITALS — BP 164/93 | HR 57

## 2017-04-18 DIAGNOSIS — S62611A Displaced fracture of proximal phalanx of left index finger, initial encounter for closed fracture: Secondary | ICD-10-CM

## 2017-04-18 DIAGNOSIS — M79645 Pain in left finger(s): Secondary | ICD-10-CM | POA: Diagnosis not present

## 2017-04-18 DIAGNOSIS — S63639A Sprain of interphalangeal joint of unspecified finger, initial encounter: Secondary | ICD-10-CM

## 2017-04-18 DIAGNOSIS — X58XXXA Exposure to other specified factors, initial encounter: Secondary | ICD-10-CM | POA: Diagnosis not present

## 2017-04-18 MED ORDER — NAPROXEN 500 MG PO TABS: 500.0000 mg | ORAL_TABLET | Freq: Two times a day (BID) | ORAL | 3 refills | Status: AC

## 2017-04-18 NOTE — Patient Instructions (Addendum)
Thank you for coming in today. Please follow up with the [facility] [address] [place], [place] [address] [phone]  Return to me as needed.    Take naproxen for pain twice daily as needed.   I am worried about a Displaced Volar Plate Fracture.

## 2017-04-18 NOTE — Progress Notes (Signed)
Nichole Beltran is a 41 y.o. female who presents to [facility] today for left index finger injury. Patient fell and "jammed" her left index finger about 5 weeks ago. She developed pain and swelling and treated with an over-the-counter finger splint. Since then she's had pain and significant stiffness especially at the PIP and mildly at the DIP. She is here today for evaluation. She notes she is almost completely unable to flex her PIP but has some flexion of the DIP.    Past Medical History:  Diagnosis Date  . Back pain    Past Surgical History:  Procedure Laterality Date  . ABDOMINAL HYSTERECTOMY     Social History  Substance Use Topics  . Smoking status: Current Every Day Smoker    Types: Cigarettes  . Smokeless tobacco: Not on file  . Alcohol use No   family history is not on file.  ROS:  No headache, visual changes, nausea, vomiting, diarrhea, constipation, dizziness, abdominal pain, skin rash, fevers, chills, night sweats, weight loss, swollen lymph nodes, body aches, joint swelling, muscle aches, chest pain, shortness of breath, mood changes, visual or auditory hallucinations.    Medications: Current Outpatient Prescriptions  Medication Sig Dispense Refill  . naproxen (NAPROSYN) 500 MG tablet Take 1 tablet (500 mg total) by mouth 2 (two) times daily with a meal. 14 tablet 3   No current facility-administered medications for this visit.    No Known Allergies   Exam:  BP (!) 164/93   Pulse (!) 57  General: Well Developed, well nourished, and in no acute distress.  Neuro/Psych: Alert and oriented x3, extra-ocular muscles intact, able to move all 4 extremities, sensation grossly intact. Skin: Warm and dry, no rashes noted.  Respiratory: Not using accessory muscles, speaking in full sentences, trachea midline.  Cardiovascular: Pulses palpable, no extremity edema. Abdomen: Does not appear distended. MSK: Left index finger  with no rotational deformity. Slight extensor lag visible at the DIP. Tender palpation at the PIP. Nontender DIP and MCP. As normal motion of MCP present. Near complete inability to flex both actively and passively the MCP. Patient has active flexion and extension strength at the DIP.  X-ray left index finger reveals a displaced appearing volar plate fracture with dorsal displacement of the middle phalanx  Attempted reduction of volar plate fracture: Consent obtained and timeout performed. Skin cleaned and an adequate digital block was performed Traction and flexion was used at the PIP to attempt to reduce the displaced, plate fracture. She did not gain any active flexion and only gained about 10 of passive flexion after the attempt.   No results found for this or any previous visit (from the past 48 hour(s)). No results found.    Assessment and Plan: 41 y.o. female with displaced volar plate fracture with significant lack of range of motion both active and passive at the PIP. This is 61 weeks old which is a bit of a shame. Plan to refer to hand surgery for potential surgery to improve function of the left index finger. Referral placed. Return as needed.    Orders Placed This Encounter  Procedures  . DG Finger Index Right    Order Specific Question:   Reason for exam:    Answer:   eval ? fx DIP, PIP    Order Specific Question:   Is the patient pregnant?    Answer:   No    Order Specific Question:   Preferred imaging location?  Answer:   [facility]  . Ambulatory referral to Hand Surgery    Referral Priority:   Routine    Referral Type:   Surgical    Referral Reason:   Specialty Services Required    Requested Specialty:   Hand Surgery    Number of Visits Requested:   1   Meds ordered this encounter  Medications  . naproxen (NAPROSYN) 500 MG tablet    Sig: Take 1 tablet (500 mg total) by mouth 2 (two) times daily with a meal.    Dispense:  14 tablet    Refill:   3    Discussed warning signs or symptoms. Please see discharge instructions. Patient expresses understanding.

## 2017-04-24 ENCOUNTER — Other Ambulatory Visit: Payer: Self-pay | Admitting: Orthopedic Surgery

## 2017-04-24 DIAGNOSIS — S62621A Displaced fracture of medial phalanx of left index finger, initial encounter for closed fracture: Secondary | ICD-10-CM

## 2018-07-17 IMAGING — DX DG FINGER INDEX 2+V*L*
3 series · 3 of 3 positions shown · non-contrast
Comparison: None.

CLINICAL DATA: Left index finger pain and swelling after injury 5
weeks ago.

EXAM:
LEFT INDEX FINGER 2+V

[finger ap]
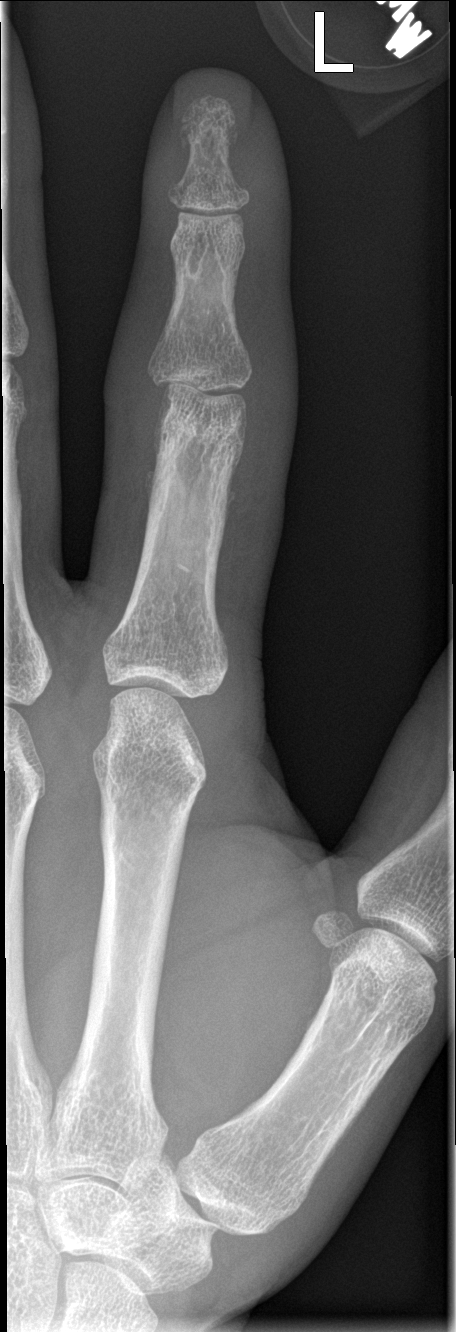

[finger obl]
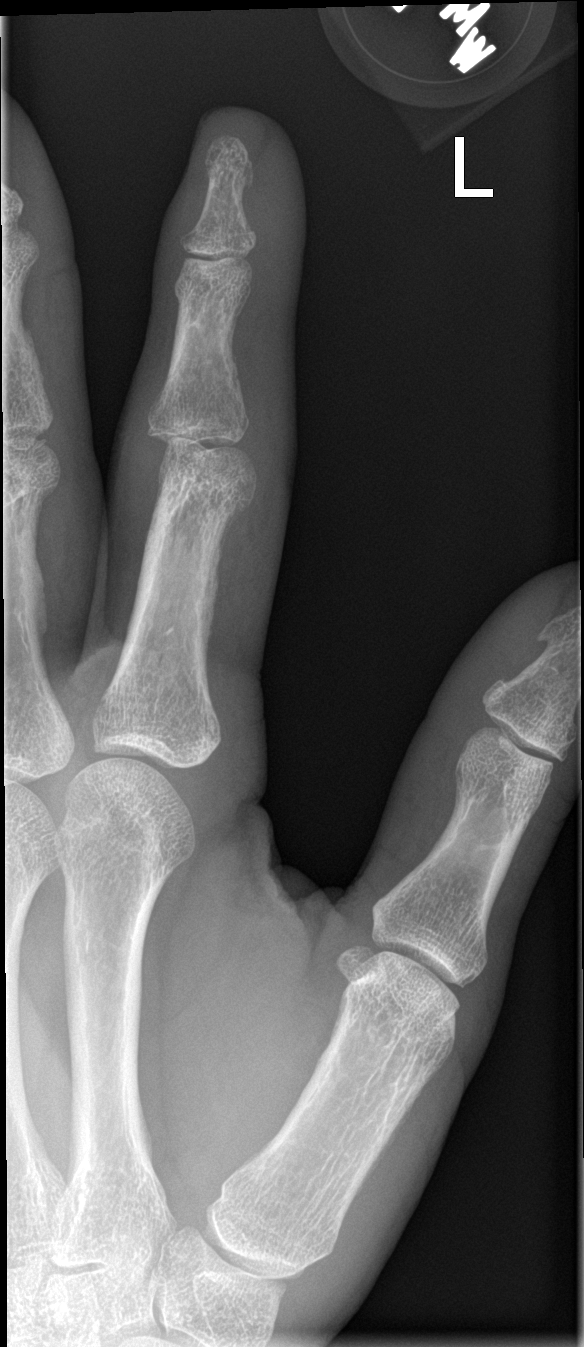

[finger lat]
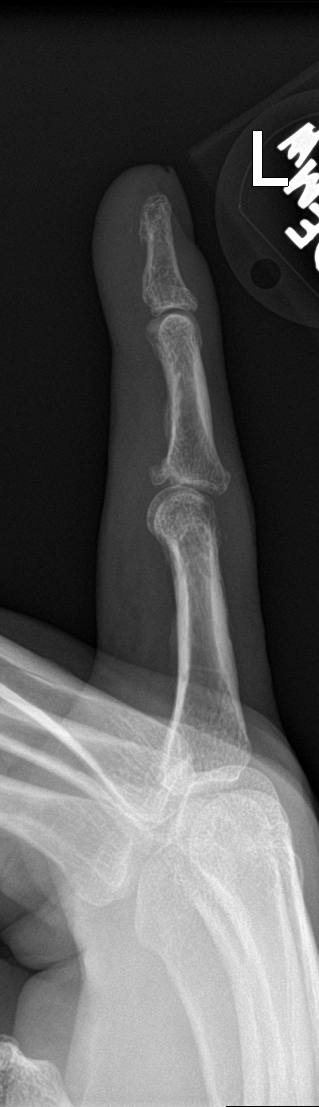

[3 of 3 positions shown; findings below may reference images not displayed]

FINDINGS: Moderately displaced fracture is seen involving the volar aspect of
the proximal base of the middle phalanx of the index finger
consistent with acute to subacute fracture. Mild posterior
subluxation of the middle phalanx relative to proximal phalanx is
noted. No other bony abnormality is noted. Soft tissues are
unremarkable.
IMPRESSION: Moderately displaced acute to subacute fracture involving the volar
aspect of the proximal base of the middle phalanx of the index
finger. Mild posterior subluxation of middle phalanx relative to
proximal phalanx is noted.

## 2022-07-07 ENCOUNTER — Telehealth: Payer: Federal, State, Local not specified - PPO | Admitting: Family Medicine

## 2022-07-07 ENCOUNTER — Telehealth: Payer: BLUE CROSS/BLUE SHIELD | Admitting: Physician Assistant

## 2022-07-07 DIAGNOSIS — B9689 Other specified bacterial agents as the cause of diseases classified elsewhere: Secondary | ICD-10-CM | POA: Diagnosis not present

## 2022-07-07 DIAGNOSIS — J019 Acute sinusitis, unspecified: Secondary | ICD-10-CM

## 2022-07-07 DIAGNOSIS — B379 Candidiasis, unspecified: Secondary | ICD-10-CM

## 2022-07-07 DIAGNOSIS — T3695XA Adverse effect of unspecified systemic antibiotic, initial encounter: Secondary | ICD-10-CM | POA: Diagnosis not present

## 2022-07-07 DIAGNOSIS — R220 Localized swelling, mass and lump, head: Secondary | ICD-10-CM

## 2022-07-07 MED ORDER — FLUCONAZOLE 150 MG PO TABS: 150.0000 mg | ORAL_TABLET | ORAL | 0 refills | Status: DC | PRN

## 2022-07-07 MED ORDER — PREDNISONE 20 MG PO TABS: 40.0000 mg | ORAL_TABLET | Freq: Every day | ORAL | 0 refills | Status: AC

## 2022-07-07 MED ORDER — AMOXICILLIN-POT CLAVULANATE 875-125 MG PO TABS: 1.0000 | ORAL_TABLET | Freq: Two times a day (BID) | ORAL | 0 refills | Status: AC

## 2022-07-07 NOTE — Progress Notes (Signed)
   Thank you for the details you included in the comment boxes. Those details are very helpful in determining the best course of treatment for you and help [place] to provide the best care.Because [name], we recommend that you convert this visit to a video visit in order for the provider to better assess what is going on due to the facial swelling and tenderness.  The provider will be able to give you a more accurate diagnosis and treatment plan if we can more freely discuss your symptoms and with the addition of a virtual examination.   If you convert to a video visit, we will bill your insurance (similar to an office visit) and you will not be charged for this e-Visit. You will be able to stay at home and speak with the first available [facility] advanced practice provider. The link to do a video visit is in the drop down Menu tab of your Welcome screen in [place].

## 2022-07-07 NOTE — Patient Instructions (Signed)
Oneida Alar, thank you for joining Mar Daring, PA-C for today's virtual visit.  While this provider is not your primary care provider (PCP), if your PCP is located in our provider database this encounter information will be shared with them immediately following your visit.   [facility] account gives you access to today's visit and all your visits, tests, and labs performed at [facility] " click here if you don't have a [facility] account or go to [email or web address]  Consent: (Patient) Oneida Alar provided verbal consent for this virtual visit at the beginning of the encounter.  Current Medications:  Current Outpatient Medications:    amoxicillin-clavulanate (AUGMENTIN) 875-125 MG tablet, Take 1 tablet by mouth 2 (two) times daily., Disp: 20 tablet, Rfl: 0   fluconazole (DIFLUCAN) 150 MG tablet, Take 1 tablet (150 mg total) by mouth every 3 (three) days as needed., Disp: 2 tablet, Rfl: 0   icosapent Ethyl (VASCEPA) 1 g capsule, Take 2 g by mouth 2 (two) times daily., Disp: , Rfl:    levocetirizine (XYZAL) 5 MG tablet, Take 5 mg by mouth every evening., Disp: , Rfl:    predniSONE (DELTASONE) 20 MG tablet, Take 2 tablets (40 mg total) by mouth daily with breakfast., Disp: 10 tablet, Rfl: 0   rosuvastatin (CRESTOR) 40 MG tablet, Take 20 mg by mouth daily., Disp: , Rfl:    Ubrogepant (UBRELVY) 100 MG TABS, Take by mouth., Disp: , Rfl:    Vitamin D, Ergocalciferol, (DRISDOL) 1.25 MG (50000 UNIT) CAPS capsule, Take 50,000 Units by mouth every 7 (seven) days., Disp: , Rfl:    Medications ordered in this encounter:  Meds ordered this encounter  Medications   amoxicillin-clavulanate (AUGMENTIN) 875-125 MG tablet    Sig: Take 1 tablet by mouth 2 (two) times daily.    Dispense:  20 tablet    Refill:  0    Order Specific Question:   Supervising Provider    Answer:   Chase Picket [[id number]]   predniSONE (DELTASONE) 20 MG tablet    Sig:  Take 2 tablets (40 mg total) by mouth daily with breakfast.    Dispense:  10 tablet    Refill:  0    Order Specific Question:   Supervising Provider    Answer:   Chase Picket [[id number]]   fluconazole (DIFLUCAN) 150 MG tablet    Sig: Take 1 tablet (150 mg total) by mouth every 3 (three) days as needed.    Dispense:  2 tablet    Refill:  0    Order Specific Question:   Supervising Provider    Answer:   Chase Picket [id number]     *If you need refills on other medications prior to your next appointment, please contact your pharmacy*  Follow-Up: Call back or seek an in-person evaluation if the symptoms worsen or if the condition fails to improve as anticipated.  [facility] [phone]  Other Instructions  Sinus Infection, Adult A sinus infection, also called sinusitis, is inflammation of your sinuses. Sinuses are hollow spaces in the bones around your face. Your sinuses are located: Around your eyes. In the middle of your forehead. Behind your nose. In your cheekbones. Mucus normally drains out of your sinuses. When your nasal tissues become inflamed or swollen, mucus can become trapped or blocked. This allows bacteria, viruses, and fungi to grow, which leads to infection. Most infections of the sinuses are caused by a virus. A  sinus infection can develop quickly. It can last for up to 4 weeks (acute) or for more than 12 weeks (chronic). A sinus infection often develops after a cold. What are the causes? This condition is caused by anything that creates swelling in the sinuses or stops mucus from draining. This includes: Allergies. Asthma. Infection from bacteria or viruses. Deformities or blockages in your nose or sinuses. Abnormal growths in the nose (nasal polyps). Pollutants, such as chemicals or irritants in the air. Infection from fungi. This is rare. What increases the risk? You are more likely to develop this condition if you: Have a weak body  defense system (immune system). Do a lot of swimming or diving. Overuse nasal sprays. Smoke. What are the signs or symptoms? The main symptoms of this condition are pain and a feeling of pressure around the affected sinuses. Other symptoms include: Stuffy nose or congestion that makes it difficult to breathe through your nose. Thick yellow or greenish drainage from your nose. Tenderness, swelling, and warmth over the affected sinuses. A cough that may get worse at night. Decreased sense of smell and taste. Extra mucus that collects in the throat or the back of the nose (postnasal drip) causing a sore throat or bad breath. Tiredness (fatigue). Fever. How is this diagnosed? This condition is diagnosed based on: Your symptoms. Your medical history. A physical exam. Tests to find out if your condition is acute or chronic. This may include: Checking your nose for nasal polyps. Viewing your sinuses using a device that has a light (endoscope). Testing for allergies or bacteria. Imaging tests, such as an MRI or CT scan. In rare cases, a bone biopsy may be done to rule out more serious types of fungal sinus disease. How is this treated? Treatment for a sinus infection depends on the cause and whether your condition is chronic or acute. If caused by a virus, your symptoms should go away on their own within 10 days. You may be given medicines to relieve symptoms. They include: Medicines that shrink swollen nasal passages (decongestants). A spray that eases inflammation of the nostrils (topical intranasal corticosteroids). Rinses that help get rid of thick mucus in your nose (nasal saline washes). Medicines that treat allergies (antihistamines). Over-the-counter pain relievers. If caused by bacteria, your health care provider may recommend waiting to see if your symptoms improve. Most bacterial infections will get better without antibiotic medicine. You may be given antibiotics if you have: A  severe infection. A weak immune system. If caused by narrow nasal passages or nasal polyps, surgery may be needed. Follow these instructions at home: Medicines Take, use, or apply over-the-counter and prescription medicines only as told by your health care provider. These may include nasal sprays. If you were prescribed an antibiotic medicine, take it as told by your health care provider. Do not stop taking the antibiotic even if you start to feel better. Hydrate and humidify  Drink enough fluid to keep your urine pale yellow. Staying hydrated will help to thin your mucus. Use a cool mist humidifier to keep the humidity level in your home above 50%. Inhale steam for 10-15 minutes, 3-4 times a day, or as told by your health care provider. You can do this in the bathroom while a hot shower is running. Limit your exposure to cool or dry air. Rest Rest as much as possible. Sleep with your head raised (elevated). Make sure you get enough sleep each night. General instructions  Apply a warm, moist washcloth to  your face 3-4 times a day or as told by your health care provider. This will help with discomfort. Use nasal saline washes as often as told by your health care provider. Wash your hands often with soap and water to reduce your exposure to germs. If soap and water are not available, use hand sanitizer. Do not smoke. Avoid being around people who are smoking (secondhand smoke). Keep all follow-up visits. This is important. Contact a health care provider if: You have a fever. Your symptoms get worse. Your symptoms do not improve within 10 days. Get help right away if: You have a severe headache. You have persistent vomiting. You have severe pain or swelling around your face or eyes. You have vision problems. You develop confusion. Your neck is stiff. You have trouble breathing. These symptoms may be an emergency. Get help right away. Call 911. Do not wait to see if the symptoms will  go away. Do not drive yourself to the hospital. Summary A sinus infection is soreness and inflammation of your sinuses. Sinuses are hollow spaces in the bones around your face. This condition is caused by nasal tissues that become inflamed or swollen. The swelling traps or blocks the flow of mucus. This allows bacteria, viruses, and fungi to grow, which leads to infection. If you were prescribed an antibiotic medicine, take it as told by your health care provider. Do not stop taking the antibiotic even if you start to feel better. Keep all follow-up visits. This is important. This information is not intended to replace advice given to you by your health care provider. Make sure you discuss any questions you have with your health care provider. Document Revised: 06/07/2021 Document Reviewed: 06/07/2021 Elsevier Patient Education  [place].    If you have been instructed to have an in-person evaluation today at a local Urgent Care facility, please use the link below. It will take you to a list of all of our available [facility], including address, phone number and hours of operation. Please do not delay care.  [facility]  If you or a family member do not have a primary care provider, use the link below to schedule a visit and establish care. When you choose a [facility] primary care physician or advanced practice provider, you gain a long-term partner in health. Find a Primary Care Provider  Learn more about [facility] in-office and virtual care options: [facility]

## 2022-07-07 NOTE — Progress Notes (Signed)
Virtual Visit Consent   Nichole Beltran, you are scheduled for a virtual visit with a [facility] provider today. Just as with appointments in the office, your consent must be obtained to participate. Your consent will be active for this visit and any virtual visit you may have with one of our providers in the next 365 days. If you have a MyChart account, a copy of this consent can be sent to you electronically.  As this is a virtual visit, video technology does not allow for your provider to perform a traditional examination. This may limit your provider's ability to fully assess your condition. If your provider identifies any concerns that need to be evaluated in person or the need to arrange testing (such as labs, EKG, etc.), we will make arrangements to do so. Although advances in technology are sophisticated, we cannot ensure that it will always work on either your end or our end. If the connection with a video visit is poor, the visit may have to be switched to a telephone visit. With either a video or telephone visit, we are not always able to ensure that we have a secure connection.  By engaging in this virtual visit, you consent to the provision of healthcare and authorize for your insurance to be billed (if applicable) for the services provided during this visit. Depending on your insurance coverage, you may receive a charge related to this service.  I need to obtain your verbal consent now. Are you willing to proceed with your visit today? Nichole Beltran has provided verbal consent on 07/07/2022 for a virtual visit (video or telephone). Mar Daring, PA-C  Date: 07/07/2022 7:43 PM  Virtual Visit via Video Note   I, Mar Daring, connected with  Nichole Beltran  ([id number], [birth year]) on 07/07/22 at  7:30 PM EST by a video-enabled telemedicine application and verified that I am speaking with the correct person using two identifiers.  Location: Patient: Virtual Visit  Location Patient: Home Provider: Virtual Visit Location Provider: Home Office   I discussed the limitations of evaluation and management by telemedicine and the availability of in person appointments. The patient expressed understanding and agreed to proceed.    History of Present Illness: Nichole Beltran is a 46 y.o. who identifies as a female who was assigned female at birth, and is being seen today for possible sinus infection.  HPI: Sinusitis This is a new problem. The current episode started in the past 7 days. There has been no fever. The pain is moderate. Associated symptoms include congestion, coughing (throat clearing), ear pain (right ear pain), headaches (has h/o migraines), sinus pressure, sneezing, a sore throat and swollen glands. Pertinent negatives include no chills, hoarse voice or neck pain. (Rhinorrhea, post nasal drainage) Treatments tried: at home migraine medications, OTC cold medications, allergy medications. The treatment provided no relief.     Problems:  Patient Active Problem List   Diagnosis Date Noted   Overweight (BMI 25.0-29.9) 08/27/2015   Hemangioma 05/19/2015   Chronic neck and back pain 05/19/2015   Thyroid nodule 05/19/2015   Chronic pain of multiple joints 05/19/2015   Back pain, chronic 05/19/2015   Carpal tunnel syndrome 05/19/2015   Cervical nerve root disorder 05/19/2015   Cervical spondylosis without myelopathy 05/19/2015   Arthritis, degenerative 05/19/2015   Major depressive disorder, recurrent episode, in partial remission with anxious distress ([place]) 05/19/2015   Gravida 2 para 2 05/19/2015   Lumbar and sacral osteoarthritis 05/19/2015  Allergies: No Known Allergies Medications:  Current Outpatient Medications:    amoxicillin-clavulanate (AUGMENTIN) 875-125 MG tablet, Take 1 tablet by mouth 2 (two) times daily., Disp: 20 tablet, Rfl: 0   fluconazole (DIFLUCAN) 150 MG tablet, Take 1 tablet (150 mg total) by mouth every 3 (three) days as  needed., Disp: 2 tablet, Rfl: 0   icosapent Ethyl (VASCEPA) 1 g capsule, Take 2 g by mouth 2 (two) times daily., Disp: , Rfl:    levocetirizine (XYZAL) 5 MG tablet, Take 5 mg by mouth every evening., Disp: , Rfl:    predniSONE (DELTASONE) 20 MG tablet, Take 2 tablets (40 mg total) by mouth daily with breakfast., Disp: 10 tablet, Rfl: 0   rosuvastatin (CRESTOR) 40 MG tablet, Take 20 mg by mouth daily., Disp: , Rfl:    Ubrogepant (UBRELVY) 100 MG TABS, Take by mouth., Disp: , Rfl:    Vitamin D, Ergocalciferol, (DRISDOL) 1.25 MG (50000 UNIT) CAPS capsule, Take 50,000 Units by mouth every 7 (seven) days., Disp: , Rfl:   Observations/Objective: Patient is well-developed, well-nourished in no acute distress.  Resting comfortably at home.  Head is normocephalic, atraumatic.  No labored breathing.  Speech is clear and coherent with logical content.  Patient is alert and oriented at baseline.    Assessment and Plan: 1. Acute bacterial sinusitis - amoxicillin-clavulanate (AUGMENTIN) 875-125 MG tablet; Take 1 tablet by mouth 2 (two) times daily.  Dispense: 20 tablet; Refill: 0 - predniSONE (DELTASONE) 20 MG tablet; Take 2 tablets (40 mg total) by mouth daily with breakfast.  Dispense: 10 tablet; Refill: 0  2. Antibiotic-induced yeast infection - fluconazole (DIFLUCAN) 150 MG tablet; Take 1 tablet (150 mg total) by mouth every 3 (three) days as needed.  Dispense: 2 tablet; Refill: 0  - Worsening symptoms that have not responded to OTC medications.  - Will give Augmentin and Prednisone - Continue allergy medications.  - Steam and humidifier can help - Stay well hydrated and get plenty of rest.  - Diflucan given as prophylaxis as patient tends to get vaginal yeast infections with antibiotic use - Seek in person evaluation if no symptom improvement or if symptoms worsen   Follow Up Instructions: I discussed the assessment and treatment plan with the patient. The patient was provided an  opportunity to ask questions and all were answered. The patient agreed with the plan and demonstrated an understanding of the instructions.  A copy of instructions were sent to the patient via MyChart unless otherwise noted below.    The patient was advised to call back or seek an in-person evaluation if the symptoms worsen or if the condition fails to improve as anticipated.  Time:  I spent 11 minutes with the patient via telehealth technology discussing the above problems/concerns.    Mar Daring, PA-C

## 2023-11-23 ENCOUNTER — Telehealth: Admitting: Physician Assistant

## 2023-11-23 DIAGNOSIS — B379 Candidiasis, unspecified: Secondary | ICD-10-CM | POA: Diagnosis not present

## 2023-11-23 DIAGNOSIS — K612 Anorectal abscess: Secondary | ICD-10-CM

## 2023-11-23 DIAGNOSIS — T3695XA Adverse effect of unspecified systemic antibiotic, initial encounter: Secondary | ICD-10-CM

## 2023-11-23 MED ORDER — HYDROCORTISONE ACETATE 25 MG RE SUPP: 25.0000 mg | Freq: Two times a day (BID) | RECTAL | 0 refills | Status: AC

## 2023-11-23 MED ORDER — SULFAMETHOXAZOLE-TRIMETHOPRIM 800-160 MG PO TABS: 1.0000 | ORAL_TABLET | Freq: Two times a day (BID) | ORAL | 0 refills | Status: AC

## 2023-11-23 MED ORDER — FLUCONAZOLE 150 MG PO TABS: 150.0000 mg | ORAL_TABLET | ORAL | 0 refills | Status: AC | PRN

## 2023-11-23 NOTE — Progress Notes (Signed)
 Virtual Visit Consent   Nichole Beltran, you are scheduled for a virtual visit with a [facility] provider today. Just as with appointments in the office, your consent must be obtained to participate. Your consent will be active for this visit and any virtual visit you may have with one of our providers in the next 365 days. If you have a MyChart account, a copy of this consent can be sent to you electronically.  As this is a virtual visit, video technology does not allow for your provider to perform a traditional examination. This may limit your provider's ability to fully assess your condition. If your provider identifies any concerns that need to be evaluated in person or the need to arrange testing (such as labs, EKG, etc.), we will make arrangements to do so. Although advances in technology are sophisticated, we cannot ensure that it will always work on either your end or our end. If the connection with a video visit is poor, the visit may have to be switched to a telephone visit. With either a video or telephone visit, we are not always able to ensure that we have a secure connection.  By engaging in this virtual visit, you consent to the provision of healthcare and authorize for your insurance to be billed (if applicable) for the services provided during this visit. Depending on your insurance coverage, you may receive a charge related to this service.  I need to obtain your verbal consent now. Are you willing to proceed with your visit today? Nichole Beltran has provided verbal consent on 11/23/2023 for a virtual visit (video or telephone). Nichole Kelp, PA-C  Date: 11/23/2023 5:27 PM   Virtual Visit via Video Note   I, Nichole Beltran, connected with  Nichole Beltran  ([id number], June 22, 1976) on 11/23/23 at  5:00 PM EDT by a video-enabled telemedicine application and verified that I am speaking with the correct person using two identifiers.  Location: Patient: Virtual Visit Location  Patient: Home Provider: Virtual Visit Location Provider: Home Office   I discussed the limitations of evaluation and management by telemedicine and the availability of in person appointments. The patient expressed understanding and agreed to proceed.    History of Present Illness: Nichole Beltran is a 48 y.o. who identifies as a female who was assigned female at birth, and is being seen today for a sore on the perineal area. 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 After this she developed a sore area on the side of the anus. Since she has been trying to limit direct pressure and has used Preparation H, but has had no improvements. She did take a photo, and described having a "couple of lesions" noted at the anal opening. One had a yellow discharge noted. She does have some bleeding with wiping, but reports it is more of a "watery blood than actual blood", assuming she is describing more of a serosanguinous drainage.    Problems:  Patient Active Problem List   Diagnosis Date Noted   Overweight (BMI 25.0-29.9) 08/27/2015   Hemangioma 05/19/2015   Chronic neck and back pain 05/19/2015   Thyroid nodule 05/19/2015   Chronic pain of multiple joints 05/19/2015   Back pain, chronic 05/19/2015   Carpal tunnel syndrome 05/19/2015   Cervical nerve root disorder 05/19/2015   Cervical spondylosis without myelopathy 05/19/2015   Arthritis, degenerative 05/19/2015  Major depressive disorder, recurrent episode, in partial remission with anxious distress ([facility]) 05/19/2015   Gravida 2 para 2 05/19/2015   Lumbar and sacral osteoarthritis 05/19/2015    Allergies: No Known Allergies Medications:  Current Outpatient Medications:    fluconazole  (DIFLUCAN ) 150 MG tablet, Take 1 tablet (150 mg total) by mouth every 3 (three) days as needed., Disp: 2 tablet, Rfl: 0   hydrocortisone (ANUSOL-HC) 25 MG suppository, Place 1  suppository (25 mg total) rectally 2 (two) times daily., Disp: 12 suppository, Rfl: 0   sulfamethoxazole-trimethoprim (BACTRIM DS) 800-160 MG tablet, Take 1 tablet by mouth 2 (two) times daily., Disp: 14 tablet, Rfl: 0   amoxicillin -clavulanate (AUGMENTIN ) 875-125 MG tablet, Take 1 tablet by mouth 2 (two) times daily., Disp: 20 tablet, Rfl: 0   icosapent Ethyl (VASCEPA) 1 g capsule, Take 2 g by mouth 2 (two) times daily., Disp: , Rfl:    levocetirizine (XYZAL) 5 MG tablet, Take 5 mg by mouth every evening., Disp: , Rfl:    predniSONE  (DELTASONE ) 20 MG tablet, Take 2 tablets (40 mg total) by mouth daily with breakfast., Disp: 10 tablet, Rfl: 0   rosuvastatin (CRESTOR) 40 MG tablet, Take 20 mg by mouth daily., Disp: , Rfl:    Ubrogepant (UBRELVY) 100 MG TABS, Take by mouth., Disp: , Rfl:    Vitamin D, Ergocalciferol, (DRISDOL) 1.25 MG (50000 UNIT) CAPS capsule, Take 50,000 Units by mouth every 7 (seven) days., Disp: , Rfl:   Observations/Objective: Patient is well-developed, well-nourished in no acute distress.  Resting comfortably at home.  Head is normocephalic, atraumatic.  No labored breathing.  Speech is clear and coherent with logical content.  Patient is alert and oriented at baseline.    Assessment and Plan: 1. Abscess of anal and rectal regions (Primary) - sulfamethoxazole-trimethoprim (BACTRIM DS) 800-160 MG tablet; Take 1 tablet by mouth 2 (two) times daily.  Dispense: 14 tablet; Refill: 0 - hydrocortisone (ANUSOL-HC) 25 MG suppository; Place 1 suppository (25 mg total) rectally 2 (two) times daily.  Dispense: 12 suppository; Refill: 0  2. Antibiotic-induced yeast infection - fluconazole  (DIFLUCAN ) 150 MG tablet; Take 1 tablet (150 mg total) by mouth every 3 (three) days as needed.  Dispense: 2 tablet; Refill: 0  - Since we are unable to perform an exam virtually and opted to not send photos since they are of a sensitive nature and would be permanently in her medical chart we will  treat for possible anal abscess vs hemorrhoid - Bactrim for abscess - Anusol- HC suppository for hemorrhoid - Sitz baths or Epsom salt soaks - Donut pillow or trying to sit on something soft to off load pressure on the area - May use Tylenol  and/or Ibuprofen if needed - Discussed topical lidocaine cream or ointment if needed - Diflucan  given as prophylaxis as patient tends to get vaginal yeast infections with antibiotic use - Seek in person evaluation if not improving or if symptoms worsen  Follow Up Instructions: I discussed the assessment and treatment plan with the patient. The patient was provided an opportunity to ask questions and all were answered. The patient agreed with the plan and demonstrated an understanding of the instructions.  A copy of instructions were sent to the patient via MyChart unless otherwise noted below.    The patient was advised to call back or seek an in-person evaluation if the symptoms worsen or if the condition fails to improve as anticipated.    Nichole Kelp, PA-C

## 2023-11-23 NOTE — Patient Instructions (Signed)
 Nichole Beltran, thank you for joining Angelia Kelp, PA-C for today's virtual visit.  While this provider is not your primary care provider (PCP), if your PCP is located in our provider database this encounter information will be shared with them immediately following your visit.   A [facility] MyChart account gives you access to today's visit and all your visits, tests, and labs performed at [facility] " click here if you don't have a [facility] MyChart account or go to [email or web address]  Consent: (Patient) Nichole Beltran provided verbal consent for this virtual visit at the beginning of the encounter.  Current Medications:  Current Outpatient Medications:    fluconazole  (DIFLUCAN ) 150 MG tablet, Take 1 tablet (150 mg total) by mouth every 3 (three) days as needed., Disp: 2 tablet, Rfl: 0   hydrocortisone (ANUSOL-HC) 25 MG suppository, Place 1 suppository (25 mg total) rectally 2 (two) times daily., Disp: 12 suppository, Rfl: 0   sulfamethoxazole-trimethoprim (BACTRIM DS) 800-160 MG tablet, Take 1 tablet by mouth 2 (two) times daily., Disp: 14 tablet, Rfl: 0   amoxicillin -clavulanate (AUGMENTIN ) 875-125 MG tablet, Take 1 tablet by mouth 2 (two) times daily., Disp: 20 tablet, Rfl: 0   icosapent Ethyl (VASCEPA) 1 g capsule, Take 2 g by mouth 2 (two) times daily., Disp: , Rfl:    levocetirizine (XYZAL) 5 MG tablet, Take 5 mg by mouth every evening., Disp: , Rfl:    predniSONE  (DELTASONE ) 20 MG tablet, Take 2 tablets (40 mg total) by mouth daily with breakfast., Disp: 10 tablet, Rfl: 0   rosuvastatin (CRESTOR) 40 MG tablet, Take 20 mg by mouth daily., Disp: , Rfl:    Ubrogepant (UBRELVY) 100 MG TABS, Take by mouth., Disp: , Rfl:    Vitamin D, Ergocalciferol, (DRISDOL) 1.25 MG (50000 UNIT) CAPS capsule, Take 50,000 Units by mouth every 7 (seven) days., Disp: , Rfl:    Medications ordered in this encounter:  Meds ordered this encounter  Medications    sulfamethoxazole-trimethoprim (BACTRIM DS) 800-160 MG tablet    Sig: Take 1 tablet by mouth 2 (two) times daily.    Dispense:  14 tablet    Refill:  0    Supervising Provider:   Corine Dice [[id number]]   hydrocortisone (ANUSOL-HC) 25 MG suppository    Sig: Place 1 suppository (25 mg total) rectally 2 (two) times daily.    Dispense:  12 suppository    Refill:  0    Supervising Provider:   LAMPTEY, PHILIP O [[id number]]   fluconazole  (DIFLUCAN ) 150 MG tablet    Sig: Take 1 tablet (150 mg total) by mouth every 3 (three) days as needed.    Dispense:  2 tablet    Refill:  0    Supervising Provider:   LAMPTEY, PHILIP O [[id number]]     *If you need refills on other medications prior to your next appointment, please contact your pharmacy*  Follow-Up: Call back or seek an in-person evaluation if the symptoms worsen or if the condition fails to improve as anticipated.  [facility] [phone]  Other Instructions Anorectal Abscess An abscess is an infected area that contains pus. An anorectal abscess is an abscess that forms near the opening of the butt (anus) or around the rectum. If it is not treated, the abscess can grow and cause other problems. These problems may include a more severe body-wide infection or pain, especially when you poop. What are the causes? An anorectal abscess is caused by clogged  glands or an infection in: The anus. The area between the anus and the scrotum in males or between the anus and the vagina in females (perineum). What increases the risk? You may be more likely to get this condition if: You are pregnant. You have diabetes or an inflammatory bowel disease, such as Crohn's disease. You have had anal sex. You have certain cancers. These include rectal cancer, leukemia, or lymphoma. You have been given medicines to kill cancer cells (chemotherapy). You have cracks in your anus (anal fissures). These cracks may form if you have constipation that  lasts for a long time or does not go away. You have a sexually transmitted infection (STI). What are the signs or symptoms? The main symptom of this condition is pain. It may be a throbbing pain that gets worse when you poop. Other symptoms include: Swelling, redness, and pus near the anus. The redness may go beyond the abscess and look like a red streak on the skin. A painful lump or tissue near the anus. Bleeding or pus-like discharge from the area. Fever or night sweats. Weakness and tiredness (fatigue). Constipation or pain when pooping. Pain in the lower part of your stomach. How is this diagnosed? This condition is diagnosed based on your medical history and a physical exam. The exam may include: A digital rectal exam. This is when your health care provider puts a gloved finger into your anus and rectum to look for problems. A vaginal exam. This is when your provider looks at the vagina for problems. Using a tube with a light and camera on the end (scope) to look at the rectum. You may also need tests, such as an MRI or CT scan. How is this treated? Treatment for this condition may include: Incision and drainage of the abscess. This is when an incision is made over the abscess to drain the pus. Medicines. These may include pain medicines, medicine to help you poop (laxatives), stool softeners, or antibiotics. Follow these instructions at home: Medicines Take over-the-counter and prescription medicines only as told by your provider. If you were prescribed antibiotics, use them as told by your provider. Do not stop using the antibiotic even if you start to feel better. Ask your provider if the medicine prescribed to you requires you to avoid driving or using machinery. Wound care  Follow instructions from your provider about how to take care of your wound. Make sure you: Wash your hands with soap and water for at least 20 seconds before and after you change your bandage (dressing).  If soap and water are not available, use hand sanitizer. Change your dressing as told by your provider. Leave stitches (sutures), skin glue, or tape strips in place. These skin closures may need to stay in place for 2 weeks or longer. If tape strip edges start to loosen and curl up, you may trim the loose edges. Do not remove tape strips completely unless your provider tells you to do that. If one or more tubes (drains) were placed to drain the pus, be careful not to pull at them. Your provider will tell you how long they need to stay in place. Check your abscess area every day for signs of infection. Check for: More redness, swelling, or pain. More fluid or blood. Warmth. Pus or a bad smell. Managing pain, stiffness, and swelling  To help with pain, try sitting: On a heating pad with the setting on low. On an inflatable donut-shaped cushion. If told, put ice on  the affected area. Put ice in a plastic bag. Place a towel between your skin and the bag. Leave the ice on for 20 minutes, 2-3 times a day. If your skin turns bright red, remove the ice right away to prevent skin damage. The risk of damage is higher if you cannot feel pain, heat, or cold. General instructions Take a sitz bath 3-4 times a day and after you poop. A sitz bath is a shallow, warm water bath that you sit down in. The water should only come up to your hips and should cover your butt. Follow instructions from your provider about what you may eat and drink. Keep all follow-up visits. Your provider may need to remove stitches and make sure that the abscess has gone away. Where to find more information [employer]: fascrs.org Contact a health care provider if: You have any signs of an infection. You have a fever or chills. You have trouble pooping. Get help right away if: You have trouble moving or using your legs. You have severe pain or pain that gets worse. You have swelling that gets  worse. You have a lot more bleeding or passing of pus. This information is not intended to replace advice given to you by your health care provider. Make sure you discuss any questions you have with your health care provider. Document Revised: 02/22/2022 Document Reviewed: 02/22/2022 Elsevier Patient Education  2024 [employer]   If you have been instructed to have an in-person evaluation today at a local Urgent Care facility, please use the link below. It will take you to a list of all of our available [facility], including address, phone number and hours of operation. Please do not delay care.  [facility]  If you or a family member do not have a primary care provider, use the link below to schedule a visit and establish care. When you choose a [facility] primary care physician or advanced practice provider, you gain a long-term partner in health. Find a Primary Care Provider  Learn more about [facility] in-office and virtual care options:  - Get Care Now

## 2023-12-20 ENCOUNTER — Other Ambulatory Visit (HOSPITAL_BASED_OUTPATIENT_CLINIC_OR_DEPARTMENT_OTHER): Payer: Self-pay | Admitting: Student in an Organized Health Care Education/Training Program

## 2023-12-20 DIAGNOSIS — Z1231 Encounter for screening mammogram for malignant neoplasm of breast: Secondary | ICD-10-CM

## 2023-12-26 ENCOUNTER — Other Ambulatory Visit: Payer: Self-pay | Admitting: Student in an Organized Health Care Education/Training Program

## 2023-12-26 DIAGNOSIS — Z1231 Encounter for screening mammogram for malignant neoplasm of breast: Secondary | ICD-10-CM

## 2024-01-01 ENCOUNTER — Encounter (HOSPITAL_BASED_OUTPATIENT_CLINIC_OR_DEPARTMENT_OTHER): Payer: Self-pay

## 2024-01-01 ENCOUNTER — Encounter (HOSPITAL_BASED_OUTPATIENT_CLINIC_OR_DEPARTMENT_OTHER): Admitting: Radiology

## 2024-01-08 ENCOUNTER — Encounter (HOSPITAL_BASED_OUTPATIENT_CLINIC_OR_DEPARTMENT_OTHER): Admitting: Radiology

## 2024-01-08 DIAGNOSIS — Z1231 Encounter for screening mammogram for malignant neoplasm of breast: Secondary | ICD-10-CM

## 2024-01-11 ENCOUNTER — Encounter (HOSPITAL_BASED_OUTPATIENT_CLINIC_OR_DEPARTMENT_OTHER): Admitting: Radiology

## 2024-01-11 DIAGNOSIS — Z1231 Encounter for screening mammogram for malignant neoplasm of breast: Secondary | ICD-10-CM

## 2024-02-05 ENCOUNTER — Inpatient Hospital Stay (HOSPITAL_BASED_OUTPATIENT_CLINIC_OR_DEPARTMENT_OTHER): Admission: RE | Admit: 2024-02-05 | Source: Ambulatory Visit | Admitting: Radiology
